# Patient Record
Sex: Male | Born: 1995 | Hispanic: Yes | Marital: Single | Smoking: Never smoker
Health system: Southern US, Community
[De-identification: ages and names within clinical notes are randomized; demographics above are authoritative.]

---
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
## 2019-06-23 ENCOUNTER — Other Ambulatory Visit: Payer: Self-pay

## 2019-06-23 DIAGNOSIS — Z20822 Contact with and (suspected) exposure to covid-19: Secondary | ICD-10-CM

## 2019-06-24 LAB — NOVEL CORONAVIRUS, NAA: SARS-CoV-2, NAA: NOT DETECTED

## 2019-06-30 ENCOUNTER — Other Ambulatory Visit: Payer: Self-pay

## 2020-05-01 ENCOUNTER — Encounter: Payer: Self-pay | Admitting: Emergency Medicine

## 2020-05-01 ENCOUNTER — Other Ambulatory Visit: Payer: Self-pay

## 2020-05-01 ENCOUNTER — Emergency Department
Admission: EM | Admit: 2020-05-01 | Discharge: 2020-05-01 | Disposition: A | Discharge status: Home / Self Care | Payer: 59 | Attending: Emergency Medicine | Admitting: Emergency Medicine

## 2020-05-01 DIAGNOSIS — Y92410 Unspecified street and highway as the place of occurrence of the external cause: Secondary | ICD-10-CM | POA: Insufficient documentation

## 2020-05-01 DIAGNOSIS — Z041 Encounter for examination and observation following transport accident: Secondary | ICD-10-CM | POA: Insufficient documentation

## 2020-05-01 DIAGNOSIS — Y999 Unspecified external cause status: Secondary | ICD-10-CM | POA: Diagnosis not present

## 2020-05-01 DIAGNOSIS — Y9389 Activity, other specified: Secondary | ICD-10-CM | POA: Insufficient documentation
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
## 2020-05-01 NOTE — Discharge Instructions (Signed)
No acute findings on exam today.  Follow-up PCP as needed.
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
